# Patient Record
Sex: Female | Born: 1970 | Hispanic: Yes | Marital: Married | Smoking: Never smoker
Health system: Southern US, Community
[De-identification: ages and names within clinical notes are randomized; demographics above are authoritative.]

## PROBLEM LIST (undated history)

## (undated) HISTORY — PX: ABDOMINAL HYSTERECTOMY: SHX81

---

## 2004-09-20 ENCOUNTER — Emergency Department: Payer: Self-pay | Admitting: Emergency Medicine

## 2006-10-29 ENCOUNTER — Observation Stay: Payer: Self-pay

## 2006-11-19 ENCOUNTER — Inpatient Hospital Stay: Payer: Self-pay

## 2012-08-02 ENCOUNTER — Ambulatory Visit: Payer: Self-pay | Admitting: Internal Medicine

## 2013-12-02 ENCOUNTER — Ambulatory Visit: Payer: Self-pay

## 2014-05-06 ENCOUNTER — Emergency Department: Payer: Self-pay | Admitting: Emergency Medicine

## 2014-05-06 LAB — URINALYSIS, COMPLETE
Bilirubin,UR: NEGATIVE
Glucose,UR: NEGATIVE mg/dL (ref 0–75)
Ketone: NEGATIVE
Nitrite: POSITIVE
PH: 5 (ref 4.5–8.0)
Protein: NEGATIVE
RBC,UR: 2 /HPF (ref 0–5)
Specific Gravity: 1.012 (ref 1.003–1.030)
Squamous Epithelial: 2

## 2014-05-06 LAB — CBC WITH DIFFERENTIAL/PLATELET
Basophil #: 0 10*3/uL (ref 0.0–0.1)
Basophil %: 0.2 %
EOS PCT: 0.1 %
Eosinophil #: 0 10*3/uL (ref 0.0–0.7)
HCT: 36.1 % (ref 35.0–47.0)
HGB: 12.4 g/dL (ref 12.0–16.0)
LYMPHS ABS: 0.7 10*3/uL — AB (ref 1.0–3.6)
Lymphocyte %: 12.3 %
MCH: 30.9 pg (ref 26.0–34.0)
MCHC: 34.4 g/dL (ref 32.0–36.0)
MCV: 90 fL (ref 80–100)
Monocyte #: 0.2 x10 3/mm (ref 0.2–0.9)
Monocyte %: 3 %
NEUTROS ABS: 4.8 10*3/uL (ref 1.4–6.5)
NEUTROS PCT: 84.4 %
Platelet: 165 10*3/uL (ref 150–440)
RBC: 4.02 10*6/uL (ref 3.80–5.20)
RDW: 12.2 % (ref 11.5–14.5)
WBC: 5.7 10*3/uL (ref 3.6–11.0)

## 2014-05-06 LAB — COMPREHENSIVE METABOLIC PANEL
ALBUMIN: 3.6 g/dL (ref 3.4–5.0)
ALK PHOS: 105 U/L
AST: 59 U/L — AB (ref 15–37)
Anion Gap: 9 (ref 7–16)
BUN: 13 mg/dL (ref 7–18)
Bilirubin,Total: 0.7 mg/dL (ref 0.2–1.0)
CREATININE: 0.83 mg/dL (ref 0.60–1.30)
Calcium, Total: 8.4 mg/dL — ABNORMAL LOW (ref 8.5–10.1)
Chloride: 105 mmol/L (ref 98–107)
Co2: 23 mmol/L (ref 21–32)
EGFR (African American): >60
Glucose: 160 mg/dL — ABNORMAL HIGH (ref 65–99)
Osmolality: 277 (ref 275–301)
Potassium: 3.3 mmol/L — ABNORMAL LOW (ref 3.5–5.1)
SGPT (ALT): 55 U/L (ref 12–78)
SODIUM: 137 mmol/L (ref 136–145)
TOTAL PROTEIN: 7.7 g/dL (ref 6.4–8.2)

## 2014-05-06 LAB — TROPONIN I

## 2014-05-06 LAB — LIPASE, BLOOD: LIPASE: 128 U/L (ref 73–393)

## 2014-05-06 LAB — PHOSPHORUS: Phosphorus: 1.1 mg/dL — ABNORMAL LOW (ref 2.5–4.9)

## 2014-05-06 LAB — MAGNESIUM: Magnesium: 1.5 mg/dL — ABNORMAL LOW

## 2014-05-07 LAB — PREGNANCY, URINE: Pregnancy Test, Urine: NEGATIVE m[IU]/mL

## 2014-05-08 LAB — URINE CULTURE

## 2014-05-11 LAB — CULTURE, BLOOD (SINGLE)

## 2015-02-05 ENCOUNTER — Ambulatory Visit
Admit: 2015-02-05 | Disposition: A | Discharge status: Home / Self Care | Payer: Self-pay | Attending: Family Medicine | Admitting: Family Medicine

## 2015-02-28 NOTE — Consult Note (Signed)
PATIENT NAME:  Jacqueline BeathNAYA, Jeaneane MR#:  [id number] DATE OF BIRTH:  1971-06-01  DATE OF CONSULTATION:  05/07/2014  REFERRING PHYSICIAN:  Dr. Bayard Malesandolph Brown CONSULTING PHYSICIAN:  Starleen Armsawood S. Cylis Ayars, MD  PRIMARY CARE PHYSICIAN: [facility]  CHIEF COMPLAINT: Fever.   HISTORY OF PRESENT ILLNESS: This is a 44 year old female without significant past medical history, who presents with fever. Reports the fever at its maximum was 104, checked by her husband. Per presentation to ED, the patient was febrile at 103. She was tachycardic upon presentation as well. She did not have any leukocytosis. Her lactic acid was 1.7. The patient has multiple complaints including generalized body ache, nausea, vomiting, as well as foul smelling urine. She denies any neck stiffness, any coffee-ground emesis, any diarrhea or bright red blood per rectum. Denies any abdominal pain, chest pain, shortness of breath. The patient's chest x-ray was negative. Urinalysis was positive for nitrite and leukocyte esterase, with 6 white blood cells. Her blood work-up was significant for mild hypophosphatemia and hypomagnesemia. Hospitalist requested to admit the patient. As discussed with the patient, at this point, she does not want to be admitted. She wants to go home. I tried to discuss with her multiple times, but the patient is insisting on going home. We went through this back and forth a couple of times, where she agreed to admission, then where she changed her mind.   PAST MEDICAL HISTORY: None.   SOCIAL HISTORY: No alcohol. No smoking. No illicit drug use.   FAMILY HISTORY: No family history of coronary artery disease at young age.   ALLERGIES: MACROBID AND MORPHINE.   HOME MEDICATIONS: None.  REVIEW OF SYSTEMS: CONSTITUTIONAL: Reports fever, chills, fatigue, weakness. Denies weight gain, weight loss.  EYES: Denies blurry vision, double vision, inflammation.  ENT: Denies tinnitus, hearing loss, excessive discharge.  Reports left earache.  RESPIRATORY: Denies cough, wheezing, hemoptysis, dyspnea.  CARDIOVASCULAR: Denies chest pain, edema, palpitations, syncope. GASTROINTESTINAL: Reports nausea, vomiting. Denies any abdominal pain, hematemesis, melena.  GENITOURINARY: Denies dysuria, hematuria, renal colic. Reports foul-smelling urine.  ENDOCRINE: Denies polyuria, polydipsia, heat or cold intolerance.  HEMATOLOGY: Denies anemia, easy bruising, bleeding diathesis.  INTEGUMENT: Denies acne, rash, or skin lesion.  MUSCULOSKELETAL: Denies any swelling, gout, cramps.  NEUROLOGIC: Denies CVA, transient ischemic attack, ataxia, vertigo, tremor.  PSYCHIATRIC: Denies anxiety, insomnia, or depression.   PHYSICAL EXAMINATION: VITAL SIGNS: Temperature 100.8, pulse 102, respiratory rate 18, blood pressure 108/70, saturating 97% on room air.  GENERAL: Well-nourished female who looks comfortable in bed, in no apparent distress.  HEENT: Head normocephalic. Pupils are equal and reactive to light. Pink conjunctivae. Anicteric sclerae. Dry oral mucosa.  NECK: Supple. No thyromegaly. No JVD.  CHEST: Good air entry bilaterally. No wheezing, rales or rhonchi.  CARDIOVASCULAR: S1, S2 heard. No rubs, murmurs or gallops.  ABDOMEN: Soft, nontender, nondistended. Bowel sounds present. No CVA tenderness.  EXTREMITIES: No edema. No clubbing. No cyanosis.  PSYCHIATRIC: Appropriate affect. Awake, alert x3. Intact judgment and insight.  NEUROLOGIC: Cranial nerves grossly intact. Motor 5/5. No focal deficits.  MUSCULOSKELETAL: No joint effusion or erythema.  SKIN: Warm and dry. Normal skin turgor.   PERTINENT LABORATORY DATA: Glucose 160, BUN 13, creatinine 10.83, sodium 137, potassium 3.3, chloride 105, CO2 23, lipase 128, magnesium 1.5, phosphorus 1.1, calcium 8.4. ALT 55, AST 15, alkaline phosphatase 105. Troponin less than 0.02. White blood cell 5.7, hemoglobin 12.4, hematocrit 36.1, platelets 165. Urinalysis positive for nitrites,  showing trace leukocyte esterase, having 6 white blood cells and +  1 bacteria. Chest x-ray showing no active disease.   ASSESSMENT AND PLAN: Systemic inflammatory response syndrome. The patient presents with tachycardia and fever. From the description of her illness, it appears to be a viral syndrome, but as well she is having positive urinary tract infection that might be causing this as well, and on physical exam the patient was noticed to have mild erythema in the left eardrum, so as well might be otitis media as well. The patient received IV vancomycin and Zosyn and azithromycin in the ED. Discussed admission with the patient a few times, but she does not want to be admitted. At this point, she wants to go home.   DISCHARGE INSTRUCTIONS: The patient was instructed to take Tylenol for fever, and she will be discharged on p.o. antibiotics, either Cipro or levofloxacin, for her urinary tract infection. Instructed to hydrate very well and to drink plenty of fluids, and to return to ED in case her fever returns or she does not feel well.  TOTAL TIME SPENT ON MEDICAL CONSULT, PATIENT CARE: 45 minutes.    ____________________________ Starleen Arms, MD dse:cg D: 05/07/2014 01:36:08 ET T: 05/07/2014 02:47:16 ET JOB#: [id number]  cc: Starleen Arms, MD, <Dictator> Zhaniya Swallows Teena Irani MD ELECTRONICALLY SIGNED 05/16/2014 0:49

## 2016-02-10 ENCOUNTER — Ambulatory Visit
Admission: RE | Admit: 2016-02-10 | Discharge: 2016-02-10 | Disposition: A | Discharge status: Home / Self Care | Payer: Self-pay | Source: Ambulatory Visit | Attending: Oncology | Admitting: Oncology

## 2016-02-10 ENCOUNTER — Ambulatory Visit: Payer: Self-pay | Attending: Oncology | Admitting: *Deleted

## 2016-02-10 ENCOUNTER — Encounter: Payer: Self-pay | Admitting: *Deleted

## 2016-02-10 VITALS — BP 144/92 | HR 83 | Temp 98.4°F | Resp 16 | Ht 67.32 in | Wt 180.7 lb

## 2016-02-10 DIAGNOSIS — Z0000 Encounter for general adult medical examination without abnormal findings: Secondary | ICD-10-CM

## 2016-02-10 NOTE — Progress Notes (Signed)
Letter mailed to inform patient of her normal mammogram.  She is to follow-up in one year.  [facility].

## 2016-02-10 NOTE — Progress Notes (Signed)
Subjective:     Patient ID: Jacqueline BeathJuana Roy, female   DOB: 1971/09/30, 45 y.o.   MRN: [id number]  HPI   Review of Systems     Objective:   Physical Exam  Pulmonary/Chest: Right breast exhibits no inverted nipple, no mass, no nipple discharge, no skin change and no tenderness. Left breast exhibits no inverted nipple, no mass, no nipple discharge, no skin change and no tenderness. Breasts are symmetrical.  Abdominal: There is no splenomegaly or hepatomegaly.  Genitourinary: No breast swelling, tenderness, discharge or bleeding. There is no rash, tenderness, lesion or injury on the right labia. There is no rash, tenderness, lesion or injury on the left labia. Right adnexum displays no mass, no tenderness and no fullness. Left adnexum displays no mass, no tenderness and no fullness. No erythema, tenderness or bleeding in the vagina. No foreign body around the vagina. No signs of injury around the vagina. No vaginal discharge found.  Large cystocele noted.  Hysterectomy.       Assessment:     45 year old English speaking Hispanic female [facility]  Clinical breast exam unremarkable.  Taught self breast awareness.  Patient with history of hysterectomy for cervical cancer.  Last pap per notes was January 2016.  Pelvic exam without masses or lesions.  Blood pressure elevated at 144/92.  She is to recheck her blood pressure at [facility] or [facility], and if remains higher than 140/90 she is to follow-up with her primary care provider.  Hand out on hypertention given to patient.  Patient has been screened for eligibility.  She does not have any insurance, Medicare or Medicaid.  She also meets financial eligibility.  Hand-out given on the Affordable Care Act.    Plan:     Screening mammogram ordered.  Will follow-up per [facility] protocol.

## 2016-02-10 NOTE — Patient Instructions (Signed)
Gave patient hand-out, Women Staying Healthy, Active and Well from [facility], with education on breast health, pap smears, heart and colon health. 

## 2016-04-07 IMAGING — CR DG CHEST 1V PORT
1 series · 1 of 1 positions shown · non-contrast
Comparison: None.

CLINICAL DATA: Sepsis.

EXAM:
PORTABLE CHEST - 1 VIEW

[ap]
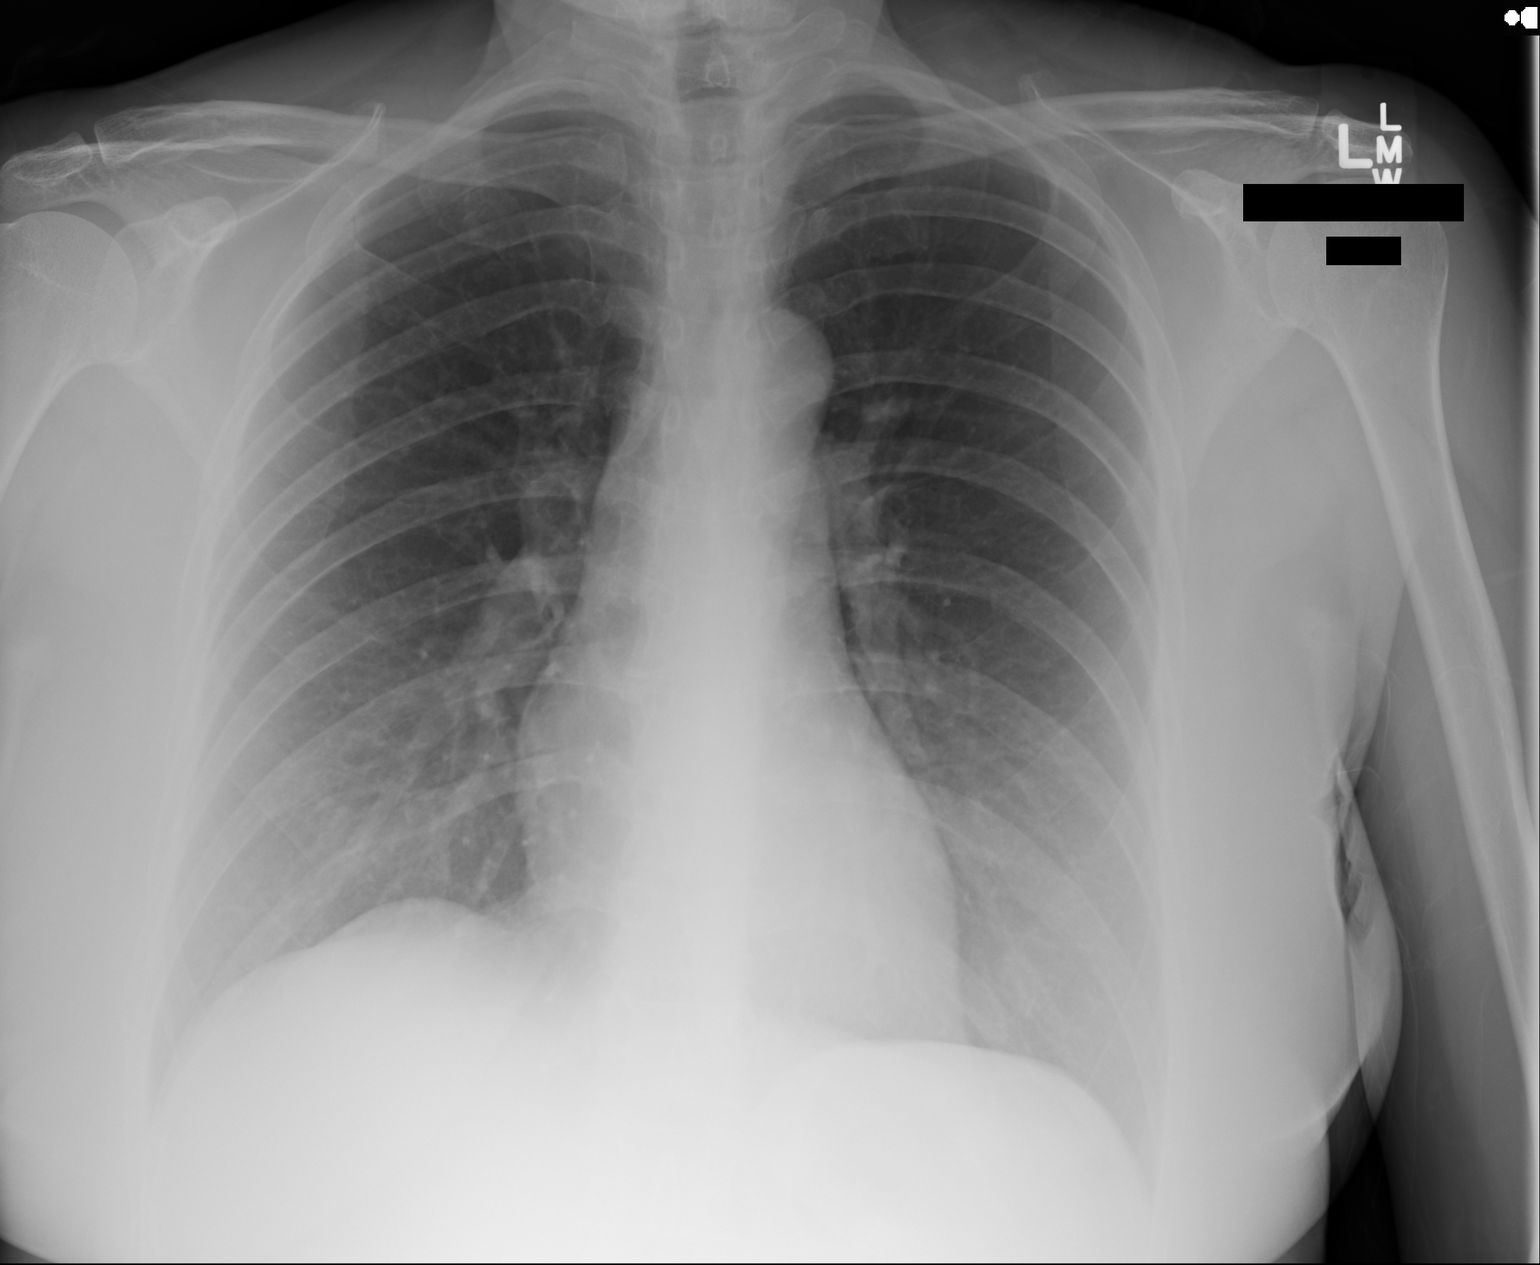

[1 of 1 positions shown; findings below may reference images not displayed]

FINDINGS: Normal heart size and mediastinal contours. No acute infiltrate or
edema. No effusion or pneumothorax. No acute osseous findings.
IMPRESSION: No active disease.

## 2016-10-23 ENCOUNTER — Emergency Department
Admission: EM | Admit: 2016-10-23 | Discharge: 2016-10-23 | Disposition: A | Discharge status: Home / Self Care | Payer: No Typology Code available for payment source | Attending: Emergency Medicine | Admitting: Emergency Medicine

## 2016-10-23 ENCOUNTER — Encounter: Payer: Self-pay | Admitting: Emergency Medicine

## 2016-10-23 DIAGNOSIS — M791 Myalgia: Secondary | ICD-10-CM | POA: Insufficient documentation

## 2016-10-23 DIAGNOSIS — Y9389 Activity, other specified: Secondary | ICD-10-CM | POA: Diagnosis not present

## 2016-10-23 DIAGNOSIS — Y92410 Unspecified street and highway as the place of occurrence of the external cause: Secondary | ICD-10-CM | POA: Diagnosis not present

## 2016-10-23 DIAGNOSIS — S161XXA Strain of muscle, fascia and tendon at neck level, initial encounter: Secondary | ICD-10-CM | POA: Insufficient documentation

## 2016-10-23 DIAGNOSIS — T148XXA Other injury of unspecified body region, initial encounter: Secondary | ICD-10-CM

## 2016-10-23 DIAGNOSIS — S199XXA Unspecified injury of neck, initial encounter: Secondary | ICD-10-CM | POA: Diagnosis present

## 2016-10-23 DIAGNOSIS — Y999 Unspecified external cause status: Secondary | ICD-10-CM | POA: Insufficient documentation
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
## 2016-10-23 NOTE — ED Notes (Signed)
Pt. Verbalizes understanding of d/c instructions and follow-up. VS stable and pain controlled per pt.  Pt. In NAD at time of d/c and denies further concerns regarding this visit. Pt. Stable at the time of departure from the unit, departing unit by the safest and most appropriate manner per that pt condition and limitations. Pt advised to return to the ED at any time for emergent concerns, or for new/worsening symptoms.   

## 2016-10-23 NOTE — Discharge Instructions (Signed)
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
## 2017-01-25 ENCOUNTER — Ambulatory Visit: Payer: No Typology Code available for payment source

## 2017-02-08 ENCOUNTER — Ambulatory Visit: Payer: Medicaid Other | Attending: Oncology | Admitting: *Deleted

## 2017-02-08 ENCOUNTER — Ambulatory Visit
Admission: RE | Admit: 2017-02-08 | Discharge: 2017-02-08 | Disposition: A | Discharge status: Home / Self Care | Payer: Medicaid Other | Source: Ambulatory Visit | Attending: Oncology | Admitting: Oncology

## 2017-02-08 ENCOUNTER — Encounter: Payer: Self-pay | Admitting: *Deleted

## 2017-02-08 ENCOUNTER — Encounter (INDEPENDENT_AMBULATORY_CARE_PROVIDER_SITE_OTHER): Payer: Self-pay

## 2017-02-08 VITALS — BP 131/84 | HR 81 | Temp 93.9°F | Resp 18 | Ht 66.0 in | Wt 174.0 lb

## 2017-02-08 DIAGNOSIS — Z0000 Encounter for general adult medical examination without abnormal findings: Secondary | ICD-10-CM

## 2017-02-08 NOTE — Patient Instructions (Signed)
Gave patient hand-out, Women Staying Healthy, Active and Well from [facility], with education on breast health, pap smears, heart and colon health. 

## 2017-02-08 NOTE — Progress Notes (Signed)
Subjective:     Patient ID: Jacqueline Roy, female   DOB: [birth year], 46 y.o.   MRN: [id number]  HPI   Review of Systems     Objective:   Physical Exam  Pulmonary/Chest: Right breast exhibits no inverted nipple, no mass, no nipple discharge, no skin change and no tenderness. Left breast exhibits no inverted nipple, no mass, no nipple discharge, no skin change and no tenderness. Breasts are symmetrical.  Abdominal: There is no splenomegaly or hepatomegaly.  Genitourinary: No labial fusion. There is no rash, tenderness, lesion or injury on the right labia. There is no rash, tenderness, lesion or injury on the left labia. Right adnexum displays no mass, no tenderness and no fullness. Left adnexum displays no mass, no tenderness and no fullness. No erythema, tenderness or bleeding in the vagina. No foreign body in the vagina. No signs of injury around the vagina. Vaginal discharge found.  Genitourinary Comments: Gaping introitus noted.  White non-odorous vaginal discharge.  No cervix noted which is consistent with a hysterectomy.       Assessment:     46 year old English speaking Latino female returns to [facility] for annual screening.  Clinical breast exam unremarkable.  Taught self breast awareness.  Pelvic exam without masses or lesions.  No cervix noted.  Patient has been screened for eligibility.  She does not have any insurance, Medicare or Medicaid.  She also meets financial eligibility.  Hand-out given on the Affordable Care Act.    Plan:     Screening mammogram ordered.  Will follow-up per [facility] protocol.

## 2017-02-09 ENCOUNTER — Encounter: Payer: Self-pay | Admitting: *Deleted

## 2017-02-09 NOTE — Progress Notes (Signed)
Letter mailed from [facility] to inform patient of her normal mammogram results.  Patient is to follow-up with annual screening in one year.  HSIS to [place]. 

## 2019-05-29 ENCOUNTER — Other Ambulatory Visit: Payer: Self-pay | Admitting: Internal Medicine

## 2019-05-29 DIAGNOSIS — Z20822 Contact with and (suspected) exposure to covid-19: Secondary | ICD-10-CM

## 2019-06-01 LAB — NOVEL CORONAVIRUS, NAA: SARS-CoV-2, NAA: NOT DETECTED

## 2022-03-08 ENCOUNTER — Other Ambulatory Visit: Payer: Self-pay | Admitting: Physician Assistant

## 2022-03-08 DIAGNOSIS — Z1231 Encounter for screening mammogram for malignant neoplasm of breast: Secondary | ICD-10-CM

## 2022-04-20 ENCOUNTER — Ambulatory Visit: Payer: Medicaid Other | Admitting: Dermatology

## 2022-04-20 DIAGNOSIS — L814 Other melanin hyperpigmentation: Secondary | ICD-10-CM | POA: Diagnosis not present

## 2022-04-20 DIAGNOSIS — L820 Inflamed seborrheic keratosis: Secondary | ICD-10-CM

## 2022-04-20 DIAGNOSIS — L578 Other skin changes due to chronic exposure to nonionizing radiation: Secondary | ICD-10-CM

## 2022-04-20 DIAGNOSIS — L821 Other seborrheic keratosis: Secondary | ICD-10-CM | POA: Diagnosis not present

## 2022-04-20 NOTE — Patient Instructions (Addendum)
Cryotherapy Aftercare  Wash gently with soap and water everyday.   Apply Vaseline and Band-Aid daily until healed.     Due to recent changes in healthcare laws, you may see results of your pathology and/or laboratory studies on MyChart before the doctors have had a chance to review them. We understand that in some cases there may be results that are confusing or concerning to you. Please understand that not all results are received at the same time and often the doctors may need to interpret multiple results in order to provide you with the best plan of care or course of treatment. Therefore, we ask that you please give us 2 business days to thoroughly review all your results before contacting the office for clarification. Should we see a critical lab result, you will be contacted sooner.   If You Need Anything After Your Visit  If you have any questions or concerns for your doctor, please call our main line at [phone] and press option 4 to reach your doctor's medical assistant. If no one answers, please leave a voicemail as directed and we will return your call as soon as possible. Messages left after 4 pm will be answered the following business day.   You may also send us a message via MyChart. We typically respond to MyChart messages within 1-2 business days.  For prescription refills, please ask your pharmacy to contact our office. Our fax number is [phone].  If you have an urgent issue when the clinic is closed that cannot wait until the next business day, you can page your doctor at the number below.    Please note that while we do our best to be available for urgent issues outside of office hours, we are not available 24/7.   If you have an urgent issue and are unable to reach us, you may choose to seek medical care at your doctor's office, retail clinic, urgent care center, or emergency room.  If you have a medical emergency, please immediately call 911 or go to the  emergency department.  Pager Numbers  - Dr. Kowalski: [phone]  - Dr. Moye: [phone]  - Dr. Stewart: [phone]  In the event of inclement weather, please call our main line at [phone] for an update on the status of any delays or closures.  Dermatology Medication Tips: Please keep the boxes that topical medications come in in order to help keep track of the instructions about where and how to use these. Pharmacies typically print the medication instructions only on the boxes and not directly on the medication tubes.   If your medication is too expensive, please contact our office at [phone] option 4 or send us a message through MyChart.   We are unable to tell what your co-pay for medications will be in advance as this is different depending on your insurance coverage. However, we may be able to find a substitute medication at lower cost or fill out paperwork to get insurance to cover a needed medication.   If a prior authorization is required to get your medication covered by your insurance company, please allow us 1-2 business days to complete this process.  Drug prices often vary depending on where the prescription is filled and some pharmacies may offer cheaper prices.  The website www.goodrx.com contains coupons for medications through different pharmacies. The prices here do not account for what the cost may be with help from insurance (it may be cheaper with your insurance), but the website can   give you the price if you did not use any insurance.  - You can print the associated coupon and take it with your prescription to the pharmacy.  - You may also stop by our office during regular business hours and pick up a GoodRx coupon card.  - If you need your prescription sent electronically to a different pharmacy, notify our office through [place] MyChart or by phone at [phone] option 4.     Si Usted Necesita Algo Despus de Su Visita  Tambin puede  enviarnos un mensaje a travs de MyChart. Por lo general respondemos a los mensajes de MyChart en el transcurso de 1 a 2 das hbiles.  Para renovar recetas, por favor pida a su farmacia que se ponga en contacto con nuestra oficina. Nuestro nmero de fax es el [phone].  Si tiene un asunto urgente cuando la clnica est cerrada y que no puede esperar hasta el siguiente da hbil, puede llamar/localizar a su doctor(a) al nmero que aparece a continuacin.   Por favor, tenga en cuenta que aunque hacemos todo lo posible para estar disponibles para asuntos urgentes fuera del horario de oficina, no estamos disponibles las 24 horas del da, los 7 das de la semana.   Si tiene un problema urgente y no puede comunicarse con nosotros, puede optar por buscar atencin mdica  en el consultorio de su doctor(a), en una clnica privada, en un centro de atencin urgente o en una sala de emergencias.  Si tiene una emergencia mdica, por favor llame inmediatamente al 911 o vaya a la sala de emergencias.  Nmeros de bper  - Dr. Kowalski: [phone]  - Dra. Moye: [phone]  - Dra. Stewart: [phone]  En caso de inclemencias del tiempo, por favor llame a nuestra lnea principal al [phone] para una actualizacin sobre el estado de cualquier retraso o cierre.  Consejos para la medicacin en dermatologa: Por favor, guarde las cajas en las que vienen los medicamentos de uso tpico para ayudarle a seguir las instrucciones sobre dnde y cmo usarlos. Las farmacias generalmente imprimen las instrucciones del medicamento slo en las cajas y no directamente en los tubos del medicamento.   Si su medicamento es muy caro, por favor, pngase en contacto con nuestra oficina llamando al [phone] y presione la opcin 4 o envenos un mensaje a travs de MyChart.   No podemos decirle cul ser su copago por los medicamentos por adelantado ya que esto es diferente dependiendo de la cobertura de su seguro.  Sin embargo, es posible que podamos encontrar un medicamento sustituto a menor costo o llenar un formulario para que el seguro cubra el medicamento que se considera necesario.   Si se requiere una autorizacin previa para que su compaa de seguros cubra su medicamento, por favor permtanos de 1 a 2 das hbiles para completar este proceso.  Los precios de los medicamentos varan con frecuencia dependiendo del lugar de dnde se surte la receta y alguna farmacias pueden ofrecer precios ms baratos.  El sitio web www.goodrx.com tiene cupones para medicamentos de diferentes farmacias. Los precios aqu no tienen en cuenta lo que podra costar con la ayuda del seguro (puede ser ms barato con su seguro), pero el sitio web puede darle el precio si no utiliz ningn seguro.  - Puede imprimir el cupn correspondiente y llevarlo con su receta a la farmacia.  - Tambin puede pasar por nuestra oficina durante el horario de atencin regular y recoger una tarjeta de cupones de GoodRx.  -   Si necesita que su receta se enve electrnicamente a una farmacia diferente, informe a nuestra oficina a travs de MyChart de [place] o por telfono llamando al [phone] y presione la opcin 4.  

## 2022-04-20 NOTE — Progress Notes (Signed)
   New Patient Visit  Subjective  Jacqueline Roy is a 51 y.o. female who presents for the following: Irregular skin lesion (On the L chest and back - irregular appearing, she has accidentally scratched them off, and noticed they are changing, pt is concerned and would like them checked. She has a fhx of skin cancer in her sister). The patient has spots, moles and lesions to be evaluated, some may be new or changing and the patient has concerns that these could be cancer.  The following portions of the chart were reviewed this encounter and updated as appropriate:   Tobacco  Allergies  Meds  Problems  Med Hx  Surg Hx  Fam Hx     Review of Systems:  No other skin or systemic complaints except as noted in HPI or Assessment and Plan.  Objective  Well appearing patient in no apparent distress; mood and affect are within normal limits.  A focused examination was performed including the face, chest, and back. Relevant physical exam findings are noted in the Assessment and Plan.  L chest x 1, back x 1 (2) Erythematous stuck-on, waxy papule or plaque   Assessment & Plan  Inflamed seborrheic keratosis (2) L chest x 1, back x 1 Symptomatic, irritating, patient would like treated.  Destruction of lesion - L chest x 1, back x 1 Complexity: simple   Destruction method: cryotherapy   Informed consent: discussed and consent obtained   Timeout:  patient name, date of birth, surgical site, and procedure verified Lesion destroyed using liquid nitrogen: Yes   Region frozen until ice ball extended beyond lesion: Yes   Outcome: patient tolerated procedure well with no complications   Post-procedure details: wound care instructions given    Seborrheic Keratoses - Stuck-on, waxy, tan-brown papules and/or plaques  - Benign-appearing - Discussed benign etiology and prognosis. - Observe - Call for any changes  Actinic Damage - chronic, secondary to cumulative UV radiation exposure/sun exposure  over time - diffuse scaly erythematous macules with underlying dyspigmentation - Recommend daily broad spectrum sunscreen SPF 30+ to sun-exposed areas, reapply every 2 hours as needed.  - Recommend staying in the shade or wearing long sleeves, sun glasses (UVA+UVB protection) and wide brim hats (4-inch brim around the entire circumference of the hat). - Call for new or changing lesions.  Lentigines - Scattered tan macules - Due to sun exposure - Benign-appering, observe - Recommend daily broad spectrum sunscreen SPF 30+ to sun-exposed areas, reapply every 2 hours as needed. - Call for any changes  Return if symptoms worsen or fail to improve.  Maylene Roes, CMA, am acting as scribe for Armida Sans, MD . Documentation: I have reviewed the above documentation for accuracy and completeness, and I agree with the above.  Armida Sans, MD

## 2022-04-22 ENCOUNTER — Encounter: Payer: Self-pay | Admitting: Dermatology

## 2022-09-05 DIAGNOSIS — Z1389 Encounter for screening for other disorder: Secondary | ICD-10-CM | POA: Diagnosis not present

## 2022-09-05 DIAGNOSIS — J029 Acute pharyngitis, unspecified: Secondary | ICD-10-CM | POA: Diagnosis not present

## 2022-09-05 DIAGNOSIS — Z1159 Encounter for screening for other viral diseases: Secondary | ICD-10-CM | POA: Diagnosis not present

## 2022-09-05 DIAGNOSIS — Z112 Encounter for screening for other bacterial diseases: Secondary | ICD-10-CM | POA: Diagnosis not present

## 2022-09-05 DIAGNOSIS — Z20822 Contact with and (suspected) exposure to covid-19: Secondary | ICD-10-CM | POA: Diagnosis not present

## 2022-10-26 DIAGNOSIS — U071 COVID-19: Secondary | ICD-10-CM | POA: Diagnosis not present

## 2022-10-26 DIAGNOSIS — R059 Cough, unspecified: Secondary | ICD-10-CM | POA: Diagnosis not present

## 2022-10-26 DIAGNOSIS — J029 Acute pharyngitis, unspecified: Secondary | ICD-10-CM | POA: Diagnosis not present

## 2022-12-18 DIAGNOSIS — U071 COVID-19: Secondary | ICD-10-CM | POA: Diagnosis not present

## 2022-12-18 DIAGNOSIS — R059 Cough, unspecified: Secondary | ICD-10-CM | POA: Diagnosis not present

## 2022-12-18 DIAGNOSIS — Z20822 Contact with and (suspected) exposure to covid-19: Secondary | ICD-10-CM | POA: Diagnosis not present

## 2023-04-13 DIAGNOSIS — H5213 Myopia, bilateral: Secondary | ICD-10-CM | POA: Diagnosis not present

## 2023-04-19 DIAGNOSIS — E663 Overweight: Secondary | ICD-10-CM | POA: Diagnosis not present

## 2023-04-19 DIAGNOSIS — Z1159 Encounter for screening for other viral diseases: Secondary | ICD-10-CM | POA: Diagnosis not present

## 2023-04-19 DIAGNOSIS — Z1331 Encounter for screening for depression: Secondary | ICD-10-CM | POA: Diagnosis not present

## 2023-04-19 DIAGNOSIS — Z1389 Encounter for screening for other disorder: Secondary | ICD-10-CM | POA: Diagnosis not present

## 2023-04-19 DIAGNOSIS — E039 Hypothyroidism, unspecified: Secondary | ICD-10-CM | POA: Diagnosis not present

## 2023-04-19 DIAGNOSIS — R030 Elevated blood-pressure reading, without diagnosis of hypertension: Secondary | ICD-10-CM | POA: Diagnosis not present

## 2023-04-19 DIAGNOSIS — Z131 Encounter for screening for diabetes mellitus: Secondary | ICD-10-CM | POA: Diagnosis not present

## 2023-04-19 DIAGNOSIS — Z0000 Encounter for general adult medical examination without abnormal findings: Secondary | ICD-10-CM | POA: Diagnosis not present

## 2023-04-19 DIAGNOSIS — Z23 Encounter for immunization: Secondary | ICD-10-CM | POA: Diagnosis not present

## 2023-04-19 DIAGNOSIS — Z13220 Encounter for screening for lipoid disorders: Secondary | ICD-10-CM | POA: Diagnosis not present

## 2023-04-20 ENCOUNTER — Other Ambulatory Visit: Payer: Self-pay | Admitting: Family Medicine

## 2023-04-20 DIAGNOSIS — Z1231 Encounter for screening mammogram for malignant neoplasm of breast: Secondary | ICD-10-CM

## 2023-05-12 DIAGNOSIS — Z1212 Encounter for screening for malignant neoplasm of rectum: Secondary | ICD-10-CM | POA: Diagnosis not present

## 2023-05-12 DIAGNOSIS — Z1211 Encounter for screening for malignant neoplasm of colon: Secondary | ICD-10-CM | POA: Diagnosis not present

## 2024-05-08 DIAGNOSIS — Z23 Encounter for immunization: Secondary | ICD-10-CM | POA: Diagnosis not present

## 2024-05-08 DIAGNOSIS — Z0000 Encounter for general adult medical examination without abnormal findings: Secondary | ICD-10-CM | POA: Diagnosis not present

## 2024-05-08 DIAGNOSIS — Z1159 Encounter for screening for other viral diseases: Secondary | ICD-10-CM | POA: Diagnosis not present

## 2024-05-08 DIAGNOSIS — Z0131 Encounter for examination of blood pressure with abnormal findings: Secondary | ICD-10-CM | POA: Diagnosis not present

## 2024-05-08 DIAGNOSIS — Z13220 Encounter for screening for lipoid disorders: Secondary | ICD-10-CM | POA: Diagnosis not present

## 2024-05-08 DIAGNOSIS — Z113 Encounter for screening for infections with a predominantly sexual mode of transmission: Secondary | ICD-10-CM | POA: Diagnosis not present

## 2024-05-08 DIAGNOSIS — E785 Hyperlipidemia, unspecified: Secondary | ICD-10-CM | POA: Diagnosis not present

## 2024-05-08 DIAGNOSIS — Z131 Encounter for screening for diabetes mellitus: Secondary | ICD-10-CM | POA: Diagnosis not present

## 2024-05-08 DIAGNOSIS — Z1389 Encounter for screening for other disorder: Secondary | ICD-10-CM | POA: Diagnosis not present

## 2024-05-08 DIAGNOSIS — Z1331 Encounter for screening for depression: Secondary | ICD-10-CM | POA: Diagnosis not present

## 2024-05-08 DIAGNOSIS — I10 Essential (primary) hypertension: Secondary | ICD-10-CM | POA: Diagnosis not present

## 2024-10-23 DIAGNOSIS — I10 Essential (primary) hypertension: Secondary | ICD-10-CM | POA: Diagnosis not present

## 2024-10-23 DIAGNOSIS — Z1159 Encounter for screening for other viral diseases: Secondary | ICD-10-CM | POA: Diagnosis not present

## 2024-10-23 DIAGNOSIS — Z0131 Encounter for examination of blood pressure with abnormal findings: Secondary | ICD-10-CM | POA: Diagnosis not present

## 2024-10-23 DIAGNOSIS — Z1389 Encounter for screening for other disorder: Secondary | ICD-10-CM | POA: Diagnosis not present

## 2024-10-23 DIAGNOSIS — Z112 Encounter for screening for other bacterial diseases: Secondary | ICD-10-CM | POA: Diagnosis not present

## 2024-10-23 DIAGNOSIS — Z20822 Contact with and (suspected) exposure to covid-19: Secondary | ICD-10-CM | POA: Diagnosis not present

## 2024-10-23 DIAGNOSIS — U071 COVID-19: Secondary | ICD-10-CM | POA: Diagnosis not present
